# Patient Record
Sex: Male | Born: 1985 | Race: White | Hispanic: No | Marital: Single | Smoking: Never smoker
Health system: Southern US, Community
[De-identification: ages and names within clinical notes are randomized; demographics above are authoritative.]

## PROBLEM LIST (undated history)

## (undated) HISTORY — PX: LIPOSUCTION: SHX10

---

## 2005-03-14 ENCOUNTER — Emergency Department (HOSPITAL_COMMUNITY): Admission: EM | Admit: 2005-03-14 | Discharge: 2005-03-15 | Payer: Self-pay | Admitting: Emergency Medicine

## 2006-07-23 ENCOUNTER — Emergency Department (HOSPITAL_COMMUNITY): Admission: EM | Admit: 2006-07-23 | Discharge: 2006-07-23 | Payer: Self-pay | Admitting: Emergency Medicine

## 2006-07-23 ENCOUNTER — Emergency Department (HOSPITAL_COMMUNITY): Admission: EM | Admit: 2006-07-23 | Discharge: 2006-07-24 | Payer: Self-pay | Admitting: Emergency Medicine

## 2016-04-06 ENCOUNTER — Emergency Department (HOSPITAL_COMMUNITY): Payer: Medicaid Other

## 2016-04-06 ENCOUNTER — Encounter (HOSPITAL_COMMUNITY): Payer: Self-pay | Admitting: Emergency Medicine

## 2016-04-06 ENCOUNTER — Emergency Department (HOSPITAL_COMMUNITY)
Admission: EM | Admit: 2016-04-06 | Discharge: 2016-04-06 | Disposition: A | Discharge status: Home / Self Care | Payer: Medicaid Other | Attending: Emergency Medicine | Admitting: Emergency Medicine

## 2016-04-06 DIAGNOSIS — Y939 Activity, unspecified: Secondary | ICD-10-CM | POA: Insufficient documentation

## 2016-04-06 DIAGNOSIS — R918 Other nonspecific abnormal finding of lung field: Secondary | ICD-10-CM | POA: Diagnosis not present

## 2016-04-06 DIAGNOSIS — R102 Pelvic and perineal pain: Secondary | ICD-10-CM | POA: Diagnosis not present

## 2016-04-06 DIAGNOSIS — R410 Disorientation, unspecified: Secondary | ICD-10-CM | POA: Diagnosis not present

## 2016-04-06 DIAGNOSIS — S40811A Abrasion of right upper arm, initial encounter: Secondary | ICD-10-CM | POA: Insufficient documentation

## 2016-04-06 DIAGNOSIS — S80811A Abrasion, right lower leg, initial encounter: Secondary | ICD-10-CM | POA: Diagnosis not present

## 2016-04-06 DIAGNOSIS — Y92410 Unspecified street and highway as the place of occurrence of the external cause: Secondary | ICD-10-CM | POA: Diagnosis not present

## 2016-04-06 DIAGNOSIS — T148XXA Other injury of unspecified body region, initial encounter: Secondary | ICD-10-CM

## 2016-04-06 DIAGNOSIS — M542 Cervicalgia: Secondary | ICD-10-CM | POA: Insufficient documentation

## 2016-04-06 DIAGNOSIS — Y999 Unspecified external cause status: Secondary | ICD-10-CM | POA: Insufficient documentation

## 2016-04-06 DIAGNOSIS — S80812A Abrasion, left lower leg, initial encounter: Secondary | ICD-10-CM | POA: Diagnosis not present

## 2016-04-06 DIAGNOSIS — S40812A Abrasion of left upper arm, initial encounter: Secondary | ICD-10-CM | POA: Diagnosis not present

## 2016-04-06 DIAGNOSIS — S8991XA Unspecified injury of right lower leg, initial encounter: Secondary | ICD-10-CM | POA: Diagnosis present

## 2016-04-06 LAB — I-STAT CG4 LACTIC ACID, ED: LACTIC ACID, VENOUS: 5.67 mmol/L — AB (ref 0.5–1.9)

## 2016-04-06 LAB — ETHANOL: ALCOHOL ETHYL (B): 208 mg/dL — AB (ref <5)

## 2016-04-06 LAB — I-STAT CHEM 8, ED
BUN: 26 mg/dL — AB (ref 6–20)
CALCIUM ION: 1.06 mmol/L — AB (ref 1.13–1.30)
Chloride: 106 mmol/L (ref 101–111)
Creatinine, Ser: 1.4 mg/dL — ABNORMAL HIGH (ref 0.61–1.24)
GLUCOSE: 154 mg/dL — AB (ref 65–99)
HCT: 48 % (ref 39.0–52.0)
Hemoglobin: 16.3 g/dL (ref 13.0–17.0)
Potassium: 3.4 mmol/L — ABNORMAL LOW (ref 3.5–5.1)
SODIUM: 141 mmol/L (ref 135–145)
TCO2: 17 mmol/L (ref 0–100)

## 2016-04-06 LAB — COMPREHENSIVE METABOLIC PANEL
ALBUMIN: 4.1 g/dL (ref 3.5–5.0)
ALT: 59 U/L (ref 17–63)
ANION GAP: 13 (ref 5–15)
AST: 50 U/L — ABNORMAL HIGH (ref 15–41)
Alkaline Phosphatase: 92 U/L (ref 38–126)
BILIRUBIN TOTAL: 0.3 mg/dL (ref 0.3–1.2)
BUN: 21 mg/dL — ABNORMAL HIGH (ref 6–20)
CALCIUM: 8.6 mg/dL — AB (ref 8.9–10.3)
CO2: 15 mmol/L — ABNORMAL LOW (ref 22–32)
Chloride: 108 mmol/L (ref 101–111)
Creatinine, Ser: 1.3 mg/dL — ABNORMAL HIGH (ref 0.61–1.24)
GLUCOSE: 160 mg/dL — AB (ref 65–99)
POTASSIUM: 3.3 mmol/L — AB (ref 3.5–5.1)
Sodium: 136 mmol/L (ref 135–145)
TOTAL PROTEIN: 7.3 g/dL (ref 6.5–8.1)

## 2016-04-06 LAB — CBC
HEMATOCRIT: 46.2 % (ref 39.0–52.0)
HEMOGLOBIN: 16 g/dL (ref 13.0–17.0)
MCH: 32.4 pg (ref 26.0–34.0)
MCHC: 34.6 g/dL (ref 30.0–36.0)
MCV: 93.5 fL (ref 78.0–100.0)
Platelets: 267 10*3/uL (ref 150–400)
RBC: 4.94 MIL/uL (ref 4.22–5.81)
RDW: 12.6 % (ref 11.5–15.5)
WBC: 29 10*3/uL — AB (ref 4.0–10.5)

## 2016-04-06 LAB — PROTIME-INR
INR: 1.1 (ref 0.00–1.49)
PROTHROMBIN TIME: 14.4 s (ref 11.6–15.2)

## 2016-04-06 LAB — CDS SEROLOGY

## 2016-04-06 LAB — TYPE AND SCREEN
ABO/RH(D): O POS
Antibody Screen: NEGATIVE

## 2016-04-06 LAB — ABO/RH: ABO/RH(D): O POS

## 2016-04-06 MED ORDER — FENTANYL CITRATE (PF) 100 MCG/2ML IJ SOLN
100.0000 ug | Freq: Once | INTRAMUSCULAR | Status: AC
  Administered 2016-04-06: 100 ug via INTRAVENOUS
  Filled 2016-04-06: qty 2

## 2016-04-06 MED ORDER — TETANUS-DIPHTH-ACELL PERTUSSIS 5-2.5-18.5 LF-MCG/0.5 IM SUSP
0.5000 mL | Freq: Once | INTRAMUSCULAR | Status: AC
  Administered 2016-04-06: 0.5 mL via INTRAMUSCULAR
  Filled 2016-04-06: qty 0.5

## 2016-04-06 MED ORDER — FENTANYL CITRATE (PF) 100 MCG/2ML IJ SOLN
INTRAMUSCULAR | Status: AC
  Filled 2016-04-06: qty 2

## 2016-04-06 MED ORDER — IBUPROFEN 800 MG PO TABS
800.0000 mg | ORAL_TABLET | Freq: Once | ORAL | Status: AC
  Administered 2016-04-06: 800 mg via ORAL
  Filled 2016-04-06: qty 1

## 2016-04-06 MED ORDER — FENTANYL CITRATE (PF) 100 MCG/2ML IJ SOLN
100.0000 ug | Freq: Once | INTRAMUSCULAR | Status: AC
  Administered 2016-04-06: 100 ug via INTRAVENOUS

## 2016-04-06 MED ORDER — IOPAMIDOL (ISOVUE-300) INJECTION 61%
INTRAVENOUS | Status: AC
  Administered 2016-04-06: 100 mL
  Filled 2016-04-06: qty 100

## 2016-04-06 MED ORDER — FENTANYL CITRATE (PF) 100 MCG/2ML IJ SOLN
50.0000 ug | Freq: Once | INTRAMUSCULAR | Status: AC
  Administered 2016-04-06: 50 ug via INTRAMUSCULAR
  Filled 2016-04-06: qty 2

## 2016-04-06 MED ORDER — MORPHINE SULFATE 15 MG PO TABS: 15.0000 mg | ORAL_TABLET | ORAL | Status: AC | PRN

## 2016-04-06 MED ORDER — ACETAMINOPHEN 500 MG PO TABS
1000.0000 mg | ORAL_TABLET | Freq: Once | ORAL | Status: AC
  Administered 2016-04-06: 1000 mg via ORAL
  Filled 2016-04-06: qty 2

## 2016-04-06 NOTE — ED Notes (Signed)
Dr. Adela Lank notified that pt. became near syncopal while assisting to get up to and change to paper scrubs .

## 2016-04-06 NOTE — ED Notes (Signed)
Rolled towels placed under bilat knees for comfort
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
## 2016-04-06 NOTE — Discharge Instructions (Signed)
Take 4 over the counter ibuprofen tablets 3 times a day or 2 over-the-counter naproxen tablets twice a day for pain. Also take tylenol 1000mg (2 extra strength) four times a day.   Then take the pain medicine if you feel like you need it. Narcotics do not help with the pain, they only make you care about it less.  You can become addicted to this, people may break into your house to steal it.  It will constipate you.  If you drive under the influence of this medicine you can get a DUI.    Musculoskeletal Pain Musculoskeletal pain is muscle and boney aches and pains. These pains can occur in any part of the body. Your caregiver may treat you without knowing the cause of the pain. They may treat you if blood or urine tests, X-rays, and other tests were normal.  CAUSES There is often not a definite cause or reason for these pains. These pains may be caused by a type of germ (virus). The discomfort may also come from overuse. Overuse includes working out too hard when your body is not fit. Boney aches also come from weather changes. Bone is sensitive to atmospheric pressure changes. HOME CARE INSTRUCTIONS   Ask when your test results will be ready. Make sure you get your test results.  Only take over-the-counter or prescription medicines for pain, discomfort, or fever as directed by your caregiver. If you were given medications for your condition, do not drive, operate machinery or power tools, or sign legal documents for 24 hours. Do not drink alcohol. Do not take sleeping pills or other medications that may interfere with treatment.  Continue all activities unless the activities cause more pain. When the pain lessens, slowly resume normal activities. Gradually increase the intensity and duration of the activities or exercise.  During periods of severe pain, bed rest may be helpful. Lay or sit in any position that is comfortable.  Putting ice on the injured area.  Put ice in a bag.  Place a towel  between your skin and the bag.  Leave the ice on for 15 to 20 minutes, 3 to 4 times a day.  Follow up with your caregiver for continued problems and no reason can be found for the pain. If the pain becomes worse or does not go away, it may be necessary to repeat tests or do additional testing. Your caregiver may need to look further for a possible cause. SEEK IMMEDIATE MEDICAL CARE IF:  You have pain that is getting worse and is not relieved by medications.  You develop chest pain that is associated with shortness or breath, sweating, feeling sick to your stomach (nauseous), or throw up (vomit).  Your pain becomes localized to the abdomen.  You develop any new symptoms that seem different or that concern you. MAKE SURE YOU:   Understand these instructions.  Will watch your condition.  Will get help right away if you are not doing well or get worse.   This information is not intended to replace advice given to you by your health care provider. Make sure you discuss any questions you have with your health care provider.   Document Released: 09/02/2005 Document Revised: 11/25/2011 Document Reviewed: 05/07/2013 Elsevier Interactive Patient Education [employer]

## 2016-04-06 NOTE — ED Notes (Signed)
Patient transported to CT scan . 
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
## 2016-04-06 NOTE — ED Notes (Signed)
Dr. Adela Lank was notified about the critical lab.Marland KitchenMarland KitchenMarland Kitchen

## 2016-04-09 ENCOUNTER — Encounter (HOSPITAL_COMMUNITY): Payer: Self-pay | Admitting: Emergency Medicine

## 2016-04-09 ENCOUNTER — Emergency Department (HOSPITAL_COMMUNITY)
Admission: EM | Admit: 2016-04-09 | Discharge: 2016-04-09 | Disposition: A | Discharge status: Home / Self Care | Payer: Medicaid Other | Attending: Emergency Medicine | Admitting: Emergency Medicine

## 2016-04-09 DIAGNOSIS — T148XXA Other injury of unspecified body region, initial encounter: Secondary | ICD-10-CM

## 2016-04-09 DIAGNOSIS — Y92410 Unspecified street and highway as the place of occurrence of the external cause: Secondary | ICD-10-CM | POA: Diagnosis not present

## 2016-04-09 DIAGNOSIS — Z79899 Other long term (current) drug therapy: Secondary | ICD-10-CM | POA: Diagnosis not present

## 2016-04-09 DIAGNOSIS — Y999 Unspecified external cause status: Secondary | ICD-10-CM | POA: Diagnosis not present

## 2016-04-09 DIAGNOSIS — S7001XA Contusion of right hip, initial encounter: Secondary | ICD-10-CM | POA: Insufficient documentation

## 2016-04-09 DIAGNOSIS — S7002XA Contusion of left hip, initial encounter: Secondary | ICD-10-CM | POA: Diagnosis not present

## 2016-04-09 DIAGNOSIS — Y939 Activity, unspecified: Secondary | ICD-10-CM | POA: Diagnosis not present

## 2016-04-09 DIAGNOSIS — M25552 Pain in left hip: Secondary | ICD-10-CM | POA: Diagnosis present

## 2016-04-09 MED ORDER — DIAZEPAM 5 MG PO TABS
10.0000 mg | ORAL_TABLET | Freq: Once | ORAL | Status: AC
  Administered 2016-04-09: 10 mg via ORAL
  Filled 2016-04-09: qty 2

## 2016-04-09 MED ORDER — OXYCODONE-ACETAMINOPHEN 5-325 MG PO TABS: 2.0000 | ORAL_TABLET | ORAL | 0 refills | Status: DC | PRN

## 2016-04-09 MED ORDER — HYDROMORPHONE HCL 1 MG/ML IJ SOLN
1.0000 mg | Freq: Once | INTRAMUSCULAR | Status: AC
  Administered 2016-04-09: 1 mg via INTRAMUSCULAR
  Filled 2016-04-09 (×2): qty 1

## 2016-04-09 MED ORDER — DIAZEPAM 5 MG PO TABS: 5.0000 mg | ORAL_TABLET | ORAL | 0 refills | Status: AC | PRN

## 2016-04-09 MED ORDER — PREDNISONE 50 MG PO TABS: ORAL_TABLET | ORAL | 0 refills | Status: AC

## 2016-04-09 MED ORDER — DEXAMETHASONE SODIUM PHOSPHATE 10 MG/ML IJ SOLN
10.0000 mg | Freq: Once | INTRAMUSCULAR | Status: AC
  Administered 2016-04-09: 10 mg via INTRAMUSCULAR
  Filled 2016-04-09: qty 1

## 2016-04-09 NOTE — ED Notes (Signed)
PT DISCHARGED. INSTRUCTIONS AND PRESCRIPTIONS GIVEN. AAOX4. PT IN NO APPARENT DISTRESS. THE OPPORTUNITY TO ASK QUESTIONS WAS PROVIDED. 
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
## 2016-04-09 NOTE — ED Notes (Signed)
Bed: WA03 Expected date:  Expected time:  Means of arrival:  Comments: 
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
## 2016-04-10 ENCOUNTER — Encounter (HOSPITAL_COMMUNITY): Payer: Self-pay | Admitting: Emergency Medicine

## 2017-10-08 ENCOUNTER — Other Ambulatory Visit (HOSPITAL_COMMUNITY): Payer: Self-pay | Admitting: Orthopedic Surgery

## 2017-10-08 DIAGNOSIS — R2242 Localized swelling, mass and lump, left lower limb: Secondary | ICD-10-CM

## 2017-10-20 ENCOUNTER — Ambulatory Visit (HOSPITAL_COMMUNITY)
Admission: RE | Admit: 2017-10-20 | Discharge: 2017-10-20 | Disposition: A | Discharge status: Home / Self Care | Payer: BLUE CROSS/BLUE SHIELD | Source: Ambulatory Visit | Attending: Orthopedic Surgery | Admitting: Orthopedic Surgery

## 2017-10-20 DIAGNOSIS — R2242 Localized swelling, mass and lump, left lower limb: Secondary | ICD-10-CM | POA: Diagnosis present

## 2017-10-20 DIAGNOSIS — R9389 Abnormal findings on diagnostic imaging of other specified body structures: Secondary | ICD-10-CM | POA: Insufficient documentation

## 2017-10-20 DIAGNOSIS — M160 Bilateral primary osteoarthritis of hip: Secondary | ICD-10-CM | POA: Diagnosis not present

## 2017-10-20 MED ORDER — GADOBENATE DIMEGLUMINE 529 MG/ML IV SOLN
20.0000 mL | Freq: Once | INTRAVENOUS | Status: AC
  Administered 2017-10-20: 20 mL via INTRAVENOUS

## 2018-04-02 ENCOUNTER — Other Ambulatory Visit: Payer: Self-pay

## 2018-04-02 ENCOUNTER — Encounter (HOSPITAL_COMMUNITY): Payer: Self-pay

## 2018-04-02 ENCOUNTER — Emergency Department (HOSPITAL_COMMUNITY)
Admission: EM | Admit: 2018-04-02 | Discharge: 2018-04-02 | Disposition: A | Discharge status: Home / Self Care | Payer: BLUE CROSS/BLUE SHIELD | Attending: Emergency Medicine | Admitting: Emergency Medicine

## 2018-04-02 DIAGNOSIS — R51 Headache: Secondary | ICD-10-CM | POA: Diagnosis not present

## 2018-04-02 DIAGNOSIS — Z5321 Procedure and treatment not carried out due to patient leaving prior to being seen by health care provider: Secondary | ICD-10-CM | POA: Insufficient documentation

## 2018-04-02 DIAGNOSIS — R519 Headache, unspecified: Secondary | ICD-10-CM

## 2018-04-02 MED ORDER — SODIUM CHLORIDE 0.9 % IV SOLN: Freq: Once | INTRAVENOUS | Status: DC

## 2018-04-02 NOTE — ED Triage Notes (Signed)
Pt states that he has had a migraine since 1130 with photosensitivity and nausea

## 2018-04-02 NOTE — ED Provider Notes (Signed)
Just as I went for evaluation the patient, and he was preparing to leave the room. I encouraged him to stay for evaluation, but patient had already removed his IV line himself and declined evaluation.    Victor Becker, Victor Stammen, MD 04/02/18 1019

## 2018-04-02 NOTE — ED Notes (Addendum)
Patient's guest expressed frustration with time it took for medication. Received verbal for fluids and upon immediately returning to room with fluids patient stated he wanted to go immediately. I asked patient to let me get the physician and returned to the room within 1 minute with physician. Patient had removed IV by himself, stating he was leaving. Patient stated no pain and no need for treatment. Patient would not wait to sign discharge paperwork.

## 2020-07-07 ENCOUNTER — Other Ambulatory Visit (HOSPITAL_BASED_OUTPATIENT_CLINIC_OR_DEPARTMENT_OTHER): Payer: Self-pay | Admitting: Physician Assistant

## 2020-07-07 DIAGNOSIS — M79671 Pain in right foot: Secondary | ICD-10-CM

## 2020-07-08 ENCOUNTER — Other Ambulatory Visit: Payer: Self-pay

## 2020-07-08 ENCOUNTER — Ambulatory Visit (HOSPITAL_BASED_OUTPATIENT_CLINIC_OR_DEPARTMENT_OTHER)
Admission: RE | Admit: 2020-07-08 | Discharge: 2020-07-08 | Disposition: A | Discharge status: Home / Self Care | Payer: Self-pay | Source: Ambulatory Visit | Attending: Physician Assistant | Admitting: Physician Assistant

## 2020-07-08 DIAGNOSIS — M79671 Pain in right foot: Secondary | ICD-10-CM | POA: Insufficient documentation

## 2020-07-09 ENCOUNTER — Emergency Department (HOSPITAL_COMMUNITY)
Admission: EM | Admit: 2020-07-09 | Discharge: 2020-07-10 | Disposition: A | Discharge status: Home / Self Care | Payer: Self-pay | Attending: Emergency Medicine | Admitting: Emergency Medicine

## 2020-07-09 ENCOUNTER — Emergency Department (HOSPITAL_COMMUNITY): Payer: Self-pay

## 2020-07-09 DIAGNOSIS — R600 Localized edema: Secondary | ICD-10-CM | POA: Insufficient documentation

## 2020-07-09 DIAGNOSIS — R609 Edema, unspecified: Secondary | ICD-10-CM

## 2020-07-09 DIAGNOSIS — M79671 Pain in right foot: Secondary | ICD-10-CM | POA: Insufficient documentation

## 2020-07-09 LAB — CBC WITH DIFFERENTIAL/PLATELET
Abs Immature Granulocytes: 0.15 10*3/uL — ABNORMAL HIGH (ref 0.00–0.07)
Basophils Absolute: 0.1 10*3/uL (ref 0.0–0.1)
Basophils Relative: 1 %
Eosinophils Absolute: 0.1 10*3/uL (ref 0.0–0.5)
Eosinophils Relative: 1 %
HCT: 46 % (ref 39.0–52.0)
Hemoglobin: 15.6 g/dL (ref 13.0–17.0)
Immature Granulocytes: 1 %
Lymphocytes Relative: 14 %
Lymphs Abs: 2.1 10*3/uL (ref 0.7–4.0)
MCH: 31.8 pg (ref 26.0–34.0)
MCHC: 33.9 g/dL (ref 30.0–36.0)
MCV: 93.9 fL (ref 80.0–100.0)
Monocytes Absolute: 1.3 10*3/uL — ABNORMAL HIGH (ref 0.1–1.0)
Monocytes Relative: 9 %
Neutro Abs: 10.7 10*3/uL — ABNORMAL HIGH (ref 1.7–7.7)
Neutrophils Relative %: 74 %
Platelets: 264 10*3/uL (ref 150–400)
RBC: 4.9 MIL/uL (ref 4.22–5.81)
RDW: 12.3 % (ref 11.5–15.5)
WBC: 14.4 10*3/uL — ABNORMAL HIGH (ref 4.0–10.5)
nRBC: 0 % (ref 0.0–0.2)

## 2020-07-09 LAB — BASIC METABOLIC PANEL
Anion gap: 11 (ref 5–15)
BUN: 22 mg/dL — ABNORMAL HIGH (ref 6–20)
CO2: 22 mmol/L (ref 22–32)
Calcium: 9 mg/dL (ref 8.9–10.3)
Chloride: 105 mmol/L (ref 98–111)
Creatinine, Ser: 0.9 mg/dL (ref 0.61–1.24)
GFR, Estimated: >60 mL/min (ref >60)
Glucose, Bld: 176 mg/dL — ABNORMAL HIGH (ref 70–99)
Potassium: 4 mmol/L (ref 3.5–5.1)
Sodium: 138 mmol/L (ref 135–145)

## 2020-07-09 LAB — URIC ACID: Uric Acid, Serum: 5.6 mg/dL (ref 3.7–8.6)

## 2020-07-09 LAB — C-REACTIVE PROTEIN: CRP: 1.8 mg/dL — ABNORMAL HIGH (ref <1.0)

## 2020-07-09 LAB — SEDIMENTATION RATE: Sed Rate: 22 mm/hr — ABNORMAL HIGH (ref 0–16)

## 2020-07-09 MED ORDER — CEFAZOLIN SODIUM-DEXTROSE 2-4 GM/100ML-% IV SOLN
2.0000 g | Freq: Once | INTRAVENOUS | Status: AC
  Administered 2020-07-09: 2 g via INTRAVENOUS
  Filled 2020-07-09: qty 100

## 2020-07-09 MED ORDER — OXYCODONE-ACETAMINOPHEN 5-325 MG PO TABS
1.0000 | ORAL_TABLET | Freq: Once | ORAL | Status: AC
  Administered 2020-07-09: 1 via ORAL
  Filled 2020-07-09: qty 1

## 2020-07-09 MED ORDER — KETOROLAC TROMETHAMINE 30 MG/ML IJ SOLN
30.0000 mg | Freq: Once | INTRAMUSCULAR | Status: AC
  Administered 2020-07-09: 30 mg via INTRAVENOUS
  Filled 2020-07-09: qty 1

## 2020-07-09 NOTE — ED Provider Notes (Signed)
[facility] EMERGENCY DEPARTMENT Provider Note   CSN: [id number] Arrival date & time: 07/09/20  1718     History Chief Complaint  Patient presents with  . Foot Pain    Victor Becker is a 34 y.o. male without significant past medical history, presenting emergency department with complaint of worsening right foot pain that initially began on Wednesday or Thursday of this week. He states he initially began having pain to the dorsum of his foot. Yesterday he noticed redness that developed with worsening swelling. Pain is mostly localized at the base of the third toe with pain with range of motion and ambulation. No injuries or wounds. He was seen at [facility] and treated with prednisone for possibility of gout without relief. He has been taking Bactrim for a wound on his right hand that has been healing well since Wednesday. No fevers or chills. No known history of gout. He is not know his family history. He does endorse alcohol use and had quite a bit of alcohol on the 17th of this month. No history of IV drug use, HIV, diabetes.   The history is provided by the patient.       No past medical history on file.  There are no problems to display for this patient.   Past Surgical History:  Procedure Laterality Date  . LIPOSUCTION         No family history on file.  Social History   Tobacco Use  . Smoking status: Never Smoker  . Smokeless tobacco: Never Used  Substance Use Topics  . Alcohol use: Yes    Comment: occassional  . Drug use: No    Types: Marijuana    Home Medications Prior to Admission medications   Medication Sig Start Date End Date Taking? Authorizing Provider  cephALEXin (KEFLEX) 500 MG capsule Take 1 capsule (500 mg total) by mouth 3 (three) times daily for 7 days. 07/10/20 07/17/20  Jerry Clyne, [place] N, PA-C  diazepam (VALIUM) 5 MG tablet Take 1 tablet (5 mg total) by mouth every 4 (four) hours as needed for muscle spasms.  04/09/16   Lorre Nick, MD  morphine (MSIR) 15 MG tablet Take 15 mg by mouth every 6 (six) hours as needed for severe pain.    [provider]  morphine (MSIR) 15 MG tablet Take 1 tablet (15 mg total) by mouth every 4 (four) hours as needed for severe pain. 04/06/16   Melene Plan, DO  oxyCODONE-acetaminophen (PERCOCET/ROXICET) 5-325 MG tablet Take 1-2 tablets by mouth every 6 (six) hours as needed for severe pain. 07/10/20   Estrella Alcaraz, [place], PA-C  predniSONE (DELTASONE) 50 MG tablet One po qd x 5 days 04/09/16   Lorre Nick, MD    Allergies    Patient has no known allergies.  Review of Systems   Review of Systems  Constitutional: Negative for chills and fever.  Musculoskeletal: Positive for arthralgias and myalgias.  Skin: Positive for color change. Negative for wound.  Allergic/Immunologic: Negative for immunocompromised state.  Neurological: Negative for numbness.  All other systems reviewed and are negative.   Physical Exam Updated Vital Signs BP 118/75 (BP Location: Right Arm)   Pulse 74   Temp 97.8 F (36.6 C) (Oral)   Resp 16   SpO2 99%   Physical Exam Vitals and nursing note reviewed.  Constitutional:      General: He is not in acute distress.    Appearance: He is well-developed.  HENT:  Head: Normocephalic and atraumatic.  Eyes:     Conjunctiva/sclera: Conjunctivae normal.  Cardiovascular:     Rate and Rhythm: Normal rate and regular rhythm.  Pulmonary:     Effort: Pulmonary effort is normal.     Breath sounds: Normal breath sounds.  Abdominal:     Palpations: Abdomen is soft.  Musculoskeletal:     Comments: Dorsum of distal right foot with erythema, warmth, swelling. This seems to localize about the base of the 3rd digit with associated TTP starting at the 3rd MTP joint and proximally into the foot. Pain with ROM of the 3rd MTP joint, no pain with ROM of the interphalangeal joints of the toe. The erythema does not extend to the toes. No wounds.  No streaking. Normal sensation and DP pulse.  Skin:    General: Skin is warm.  Neurological:     Mental Status: He is alert.  Psychiatric:        Behavior: Behavior normal.     ED Results / Procedures / Treatments   Labs (all labs ordered are listed, but only abnormal results are displayed) Labs Reviewed  CBC WITH DIFFERENTIAL/PLATELET - Abnormal; Notable for the following components:      Result Value   WBC 14.4 (*)    Neutro Abs 10.7 (*)    Monocytes Absolute 1.3 (*)    Abs Immature Granulocytes 0.15 (*)    All other components within normal limits  BASIC METABOLIC PANEL - Abnormal; Notable for the following components:   Glucose, Bld 176 (*)    BUN 22 (*)    All other components within normal limits  SEDIMENTATION RATE - Abnormal; Notable for the following components:   Sed Rate 22 (*)    All other components within normal limits  C-REACTIVE PROTEIN - Abnormal; Notable for the following components:   CRP 1.8 (*)    All other components within normal limits  URIC ACID    EKG None  Radiology MR FOOT RIGHT WO CONTRAST  Result Date: 07/09/2020 CLINICAL DATA:  Foot pain, chronic, inflammatory arthropathy suspected EXAM: MRI OF THE RIGHT FOREFOOT WITHOUT CONTRAST TECHNIQUE: Multiplanar, multisequence MR imaging of the was performed. No intravenous contrast was administered. COMPARISON:  None. FINDINGS: Bones/Joint/Cartilage Mildly increased marrow signal without T1 hypointensity seen within the third metatarsal head. No areas of cortical destruction or periosteal reaction are noted. There is a small joint effusion seen at the second through fourth MTP joints. Ligaments The Lisfranc ligaments are intact. Muscles and Tendons There is increased feathery signal seen within the dorsal and plantar interossei muscle surrounding the third MTP joint. The flexor and extensor tendons appear to be intact. Soft tissues There is diffuse dorsal subcutaneous edema seen along the forefoot. There  is also edema within the plantar subcutaneous soft tissues. IMPRESSION: Third metatarsal head reactive marrow/edema which could be due to inflammatory process. No definite evidence of osteomyelitis. Signal changes within the interossei muscles surrounding the third MTP joint, which could be due to myositis Dorsal subcutaneous edema and no evidence of soft tissue abscess Electronically Signed   By: Jonna ClarkBindu  Avutu M.D.   On: 07/09/2020 23:58   US Venous Img Lower Unilateral Right (DVT)  Result Date: 07/08/2020 CLINICAL DATA:  Right lower extremity pain and edema. EXAM: RIGHT LOWER EXTREMITY VENOUS DOPPLER ULTRASOUND TECHNIQUE: Gray-scale sonography with graded compression, as well as color Doppler and duplex ultrasound were performed to evaluate the lower extremity deep venous systems from the level of the common femoral vein and  including the common femoral, femoral, profunda femoral, popliteal and calf veins including the posterior tibial, peroneal and gastrocnemius veins when visible. The superficial great saphenous vein was also interrogated. Spectral Doppler was utilized to evaluate flow at rest and with distal augmentation maneuvers in the common femoral, femoral and popliteal veins. COMPARISON:  None. FINDINGS: Contralateral Common Femoral Vein: Respiratory phasicity is normal and symmetric with the symptomatic side. No evidence of thrombus. Normal compressibility. Common Femoral Vein: No evidence of thrombus. Normal compressibility, respiratory phasicity and response to augmentation. Saphenofemoral Junction: No evidence of thrombus. Normal compressibility and flow on color Doppler imaging. Profunda Femoral Vein: No evidence of thrombus. Normal compressibility and flow on color Doppler imaging. Femoral Vein: No evidence of thrombus. Normal compressibility, respiratory phasicity and response to augmentation. Popliteal Vein: No evidence of thrombus. Normal compressibility, respiratory phasicity and response to  augmentation. Calf Veins: No evidence of thrombus. Normal compressibility and flow on color Doppler imaging. Superficial Great Saphenous Vein: No evidence of thrombus. Normal compressibility. Venous Reflux:  None. Other Findings: No evidence of superficial thrombophlebitis or abnormal fluid collection. IMPRESSION: No evidence of right lower extremity deep venous thrombosis. Electronically Signed   By: Irish Lack M.D.   On: 07/08/2020 11:56   DG Foot Complete Right  Result Date: 07/09/2020 CLINICAL DATA:  Pain and edema. EXAM: RIGHT FOOT COMPLETE - 3+ VIEW COMPARISON:  None. FINDINGS: There is no evidence of fracture or dislocation. There is no evidence of arthropathy or other focal bone abnormality. Soft tissues are unremarkable. IMPRESSION: Negative. Electronically Signed   By: Katherine Mantle M.D.   On: 07/09/2020 18:50    Procedures Procedures (including critical care time)  Medications Ordered in ED Medications  oxyCODONE-acetaminophen (PERCOCET/ROXICET) 5-325 MG per tablet 1 tablet (1 tablet Oral Given 07/09/20 2037)  ceFAZolin (ANCEF) IVPB 2g/100 mL premix (0 g Intravenous Stopped 07/09/20 2233)  ketorolac (TORADOL) 30 MG/ML injection 30 mg (30 mg Intravenous Given 07/09/20 2127)    ED Course  I have reviewed the triage vital signs and the nursing notes.  Pertinent labs & imaging results that were available during my care of the patient were reviewed by me and considered in my medical decision making (see chart for details).  Clinical Course as of Jul 10 10  Wynelle Link Jul 09, 2020  2109 Consulted with orthopedic specialist Dr. Charlann Boxer.  Discussed patient's laboratory work-up and history/presentation as well as examination today.  Discussed concerns for possible gout versus septic joint versus cellulitis though failed treatment with prednisone and Bactrim antibiotic.  He recommends MRI of the foot for further evaluation.  These imaging results will be reviewed and followed by  orthopedics during outpatient follow-up this week with Dr. Victorino Dike either on Monday or Wednesday in clinic.  He recommends a 2 g dose of IV Ancef here as well as Toradol for anti-inflammatory benefit.  As patient is stable without systemic symptoms and well-appearing, reliable for follow-up, recommends he is appropriate for recommend discharge home with Keflex.    [JR]    Clinical Course User Index [JR] Eliya Bubar, [place], PA-C   MDM Rules/Calculators/A&P                          Patient presenting with worsening pain since, swelling, redness to right foot seems to localize about the third MTP joint.  He has pain with range of motion.  No history of gout though does endorse alcohol use.  No systemic symptoms.  No history  of immunocompromise or IV drug use.  No injuries or wounds.  Neurovascularly intact.  Considered gout versus cellulitis versus septic arthritis.  Patient however has been taking Bactrim since Wednesday for a skin infection on his hand that is well-healing, however redness and warmth is present despite antibiotic use.  He is also been taking prednisone for possible gouty arthritis, however symptoms continue to worsen.  Patient also had outpatient venous ultrasound done to evaluate for DVT which was negative with negative plain films outpatient.  Plain films were repeated today and remain negative for obvious evidence of osteomyelitis or other arthritis.  Laboratory work-up obtained which reveals slight leukocytosis of 14.4, unclear if due to prednisone versus infection.  Inflammatory markers, sed rate is elevated at 22, CRP also elevated at 1.8.  Uric acid is within normal limits.  Patient discussed with and evaluated by Dr. Madilyn Hook.  Consulted with orthopedic surgeon Dr. Charlann Boxer who recommended IV Ancef, Toradol, MRI of his foot.  MRI results to be reviewed and followed by orthopedics outpatient.  Patient seems reliable for follow-up and agrees with this care plan.  Pain treated in the ED while  awaiting MRI.  MRI with findings with inflammatory changes to the third metatarsal head, no clear evidence of osteomyelitis.  Findings of possible myositis. Will discharge as planned with Keflex, NSAIDs, pain medication, and referral to Dr. Victorino Dike with orthopedics for close follow-up.  He is instructed of strict return precautions, including fevers/chills, or worsening symptoms.  [place] Controlled Substance reporting System queried  Final Clinical Impression(s) / ED Diagnoses Final diagnoses:  Edema  Right foot pain    Rx / DC Orders ED Discharge Orders         Ordered    oxyCODONE-acetaminophen (PERCOCET/ROXICET) 5-325 MG tablet  Every 6 hours PRN        07/10/20 0010    cephALEXin (KEFLEX) 500 MG capsule  3 times daily        07/10/20 0010           Kaja Jackowski, [place], PA-C 07/10/20 0011    Tilden Fossa, MD 07/13/20 [phone]

## 2020-07-09 NOTE — ED Triage Notes (Signed)
Pt arrives via pov with reports of R foot pain with swelling and redness X4 days. States seen for same and xray negative as well as DVT scan negative. Pt unable to bear weight.

## 2020-07-09 NOTE — ED Notes (Signed)
Pt transported to MRI 

## 2020-07-09 NOTE — ED Notes (Signed)
Pt transported to XRAY °

## 2020-07-10 MED ORDER — CEPHALEXIN 500 MG PO CAPS: 500.0000 mg | ORAL_CAPSULE | Freq: Three times a day (TID) | ORAL | 0 refills | Status: AC

## 2020-07-10 MED ORDER — OXYCODONE-ACETAMINOPHEN 5-325 MG PO TABS
1.0000 | ORAL_TABLET | Freq: Four times a day (QID) | ORAL | 0 refills | Status: AC | PRN
Start: 2020-07-10 — End: ?

## 2020-07-10 NOTE — Discharge Instructions (Addendum)
Begin taking the antibiotic, Keflex, as directed until gone. Take 600 mg of ibuprofen every 6 hours for pain and swelling. Elevate your foot is much as possible, apply ice for 20 minutes at a time. You can take the oxycodone every 6 hours for more severe pain.  Do not take tramadol with this. First thing in the morning tomorrow, call Dr. Laverta Baltimore office to schedule an appointment on either Monday or Wednesday of this week for close follow-up and further management of your MRI findings and right foot pain. Return to the emergency department if you develop fever or significantly worsening symptoms.

## 2022-10-11 IMAGING — US US EXTREM LOW VENOUS*R*
1 series · 13 of 24 positions shown · non-contrast
Comparison: None.

CLINICAL DATA: Right lower extremity pain and edema.



[Series 1: us extrem low venous*right* · 13 of 32 slices shown]
[im 1/32]
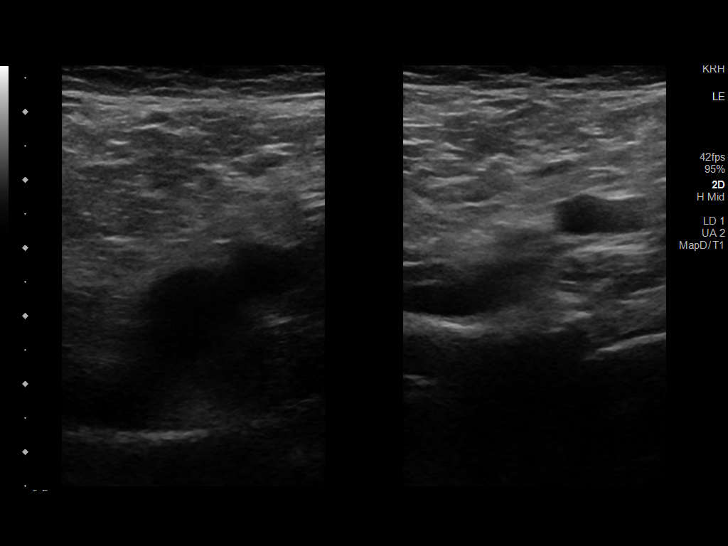
[im 3/32]
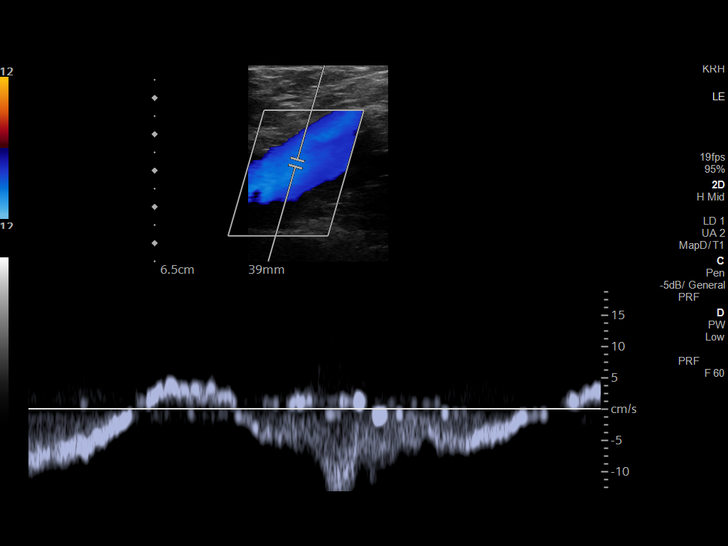
[im 6/32]
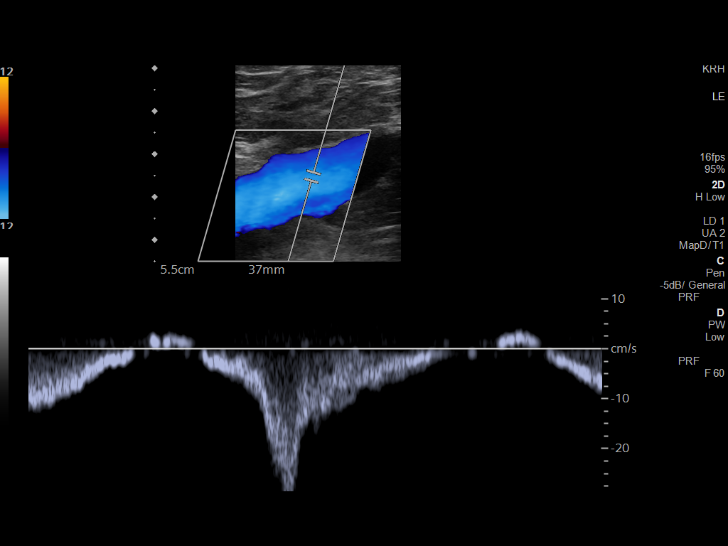
[im 9/32]
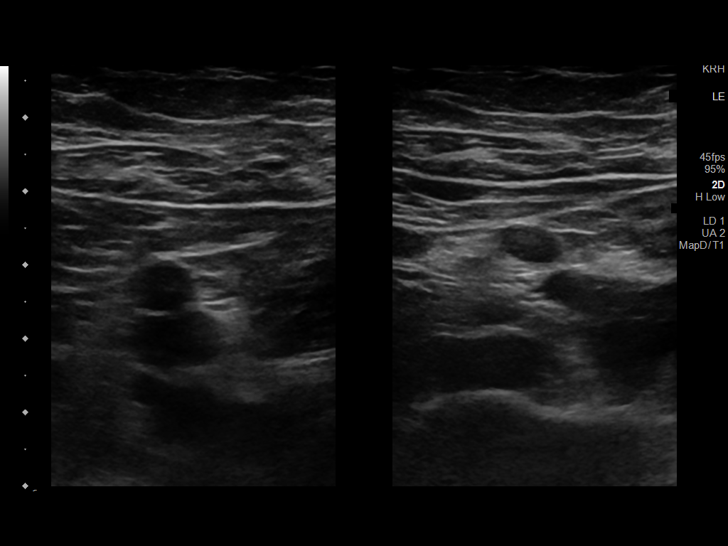
[im 11/32]
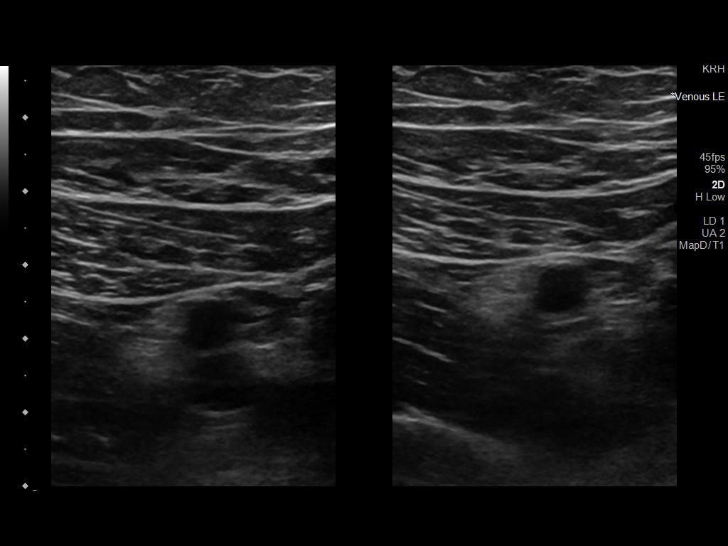
[im 14/32]
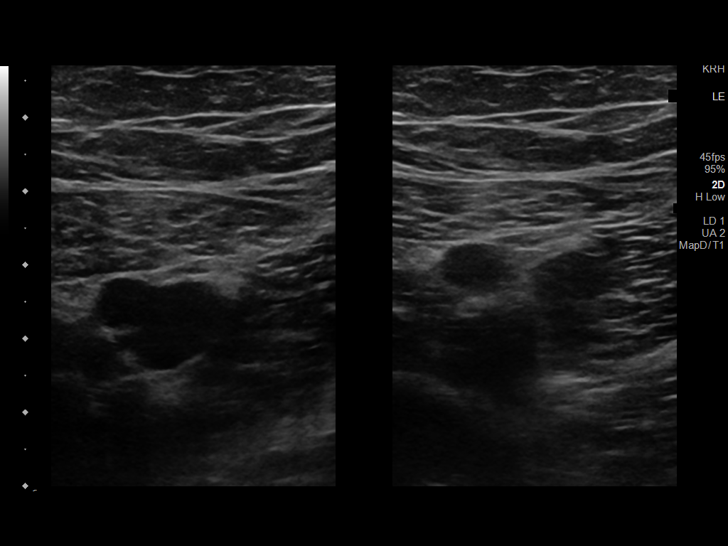
[im 17/32]
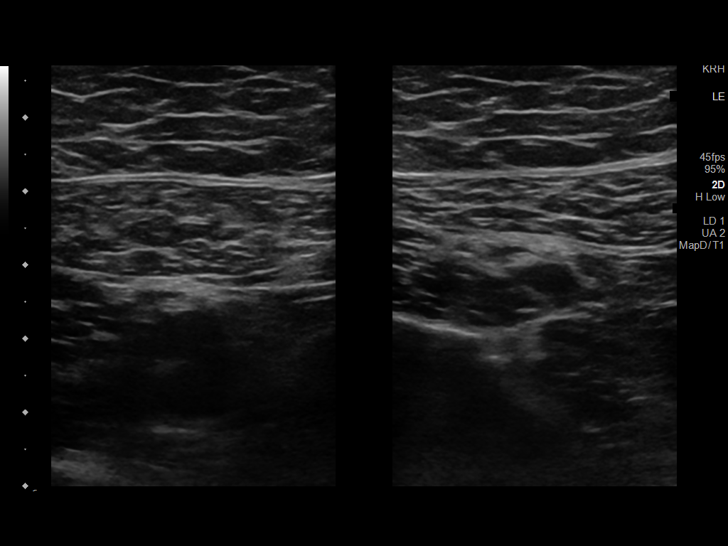
[im 18/32]
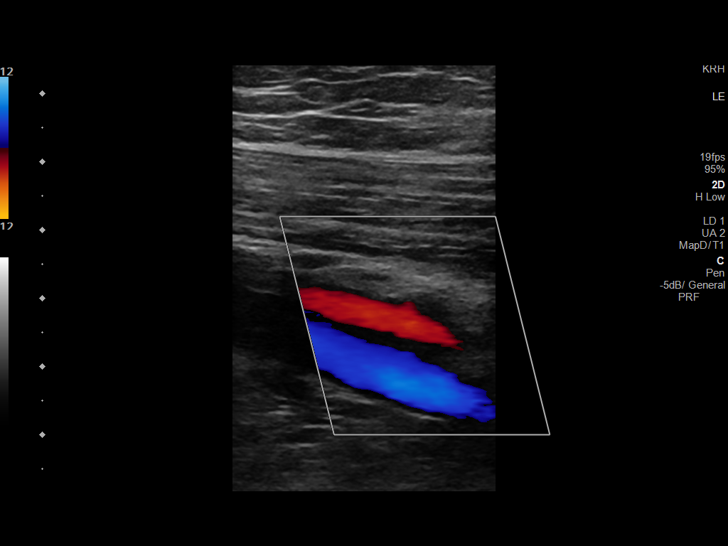
[im 21/32]
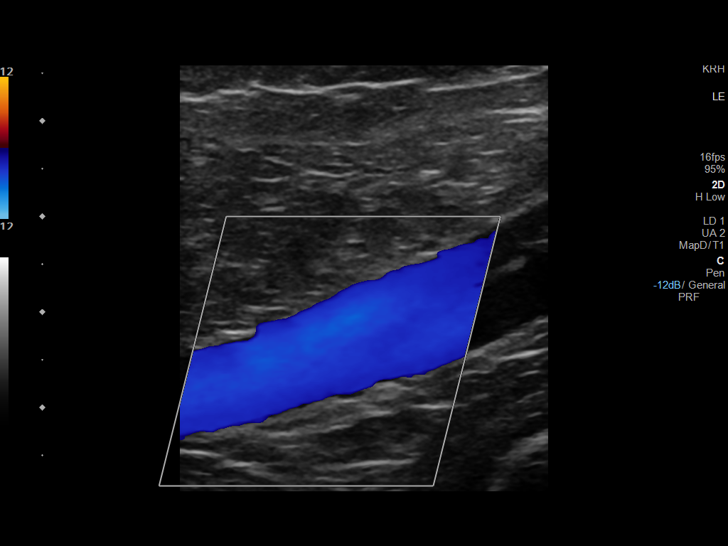
[im 23/32]
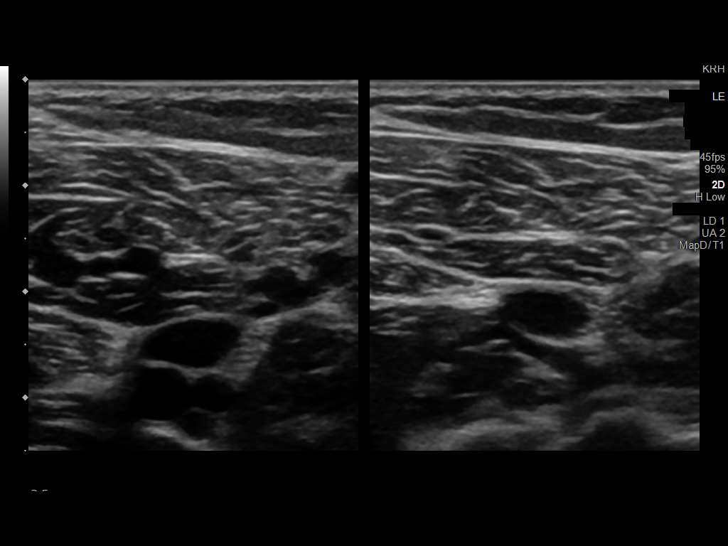
[im 26/32]
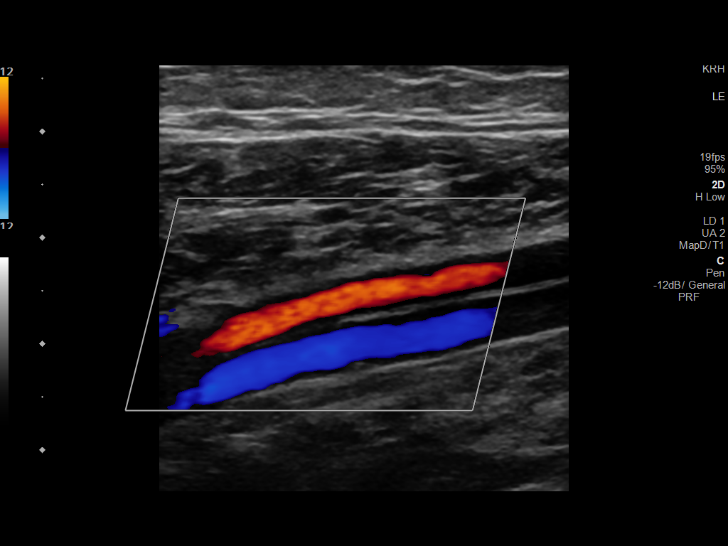
[im 29/32]
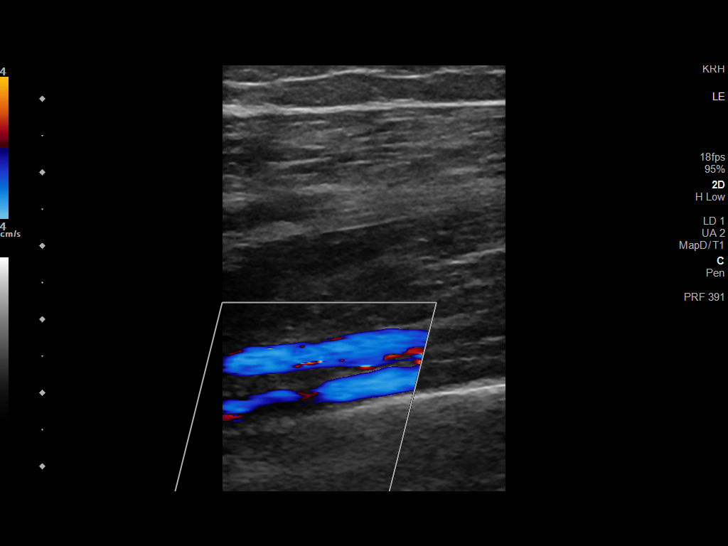
[im 32/32]
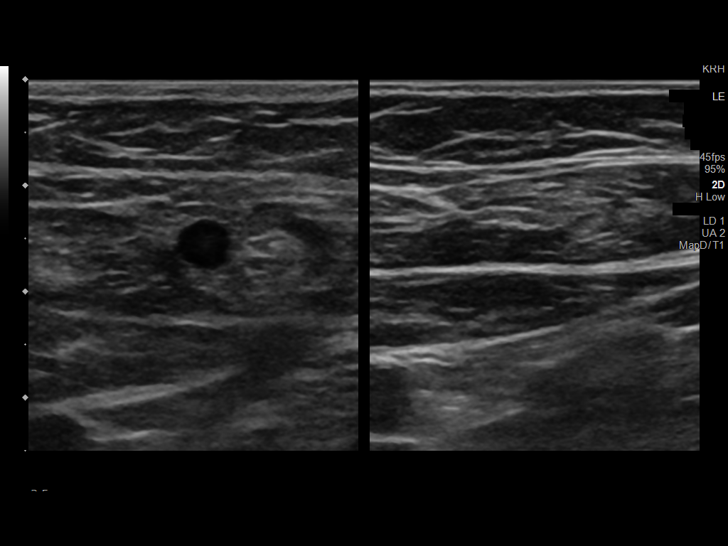

[13 of 24 positions shown; findings below may reference images not displayed]

FINDINGS: Contralateral Common Femoral Vein: Respiratory phasicity is normal
and symmetric with the symptomatic side. No evidence of thrombus.
Normal compressibility.

Common Femoral Vein: No evidence of thrombus. Normal
compressibility, respiratory phasicity and response to augmentation.

Saphenofemoral Junction: No evidence of thrombus. Normal
compressibility and flow on color Doppler imaging.

Profunda Femoral Vein: No evidence of thrombus. Normal
compressibility and flow on color Doppler imaging.

Femoral Vein: No evidence of thrombus. Normal compressibility,
respiratory phasicity and response to augmentation.

Popliteal Vein: No evidence of thrombus. Normal compressibility,
respiratory phasicity and response to augmentation.

Calf Veins: No evidence of thrombus. Normal compressibility and flow
on color Doppler imaging.

Superficial Great Saphenous Vein: No evidence of thrombus. Normal
compressibility.

Venous Reflux:  None.

Other Findings: No evidence of superficial thrombophlebitis or
abnormal fluid collection.
IMPRESSION: No evidence of right lower extremity deep venous thrombosis.

## 2022-10-12 IMAGING — DX DG FOOT COMPLETE 3+V*R*
3 series · 3 of 3 positions shown · non-contrast
Comparison: None.

CLINICAL DATA: Pain and edema.

EXAM:
RIGHT FOOT COMPLETE - 3+ VIEW

[foot ap]
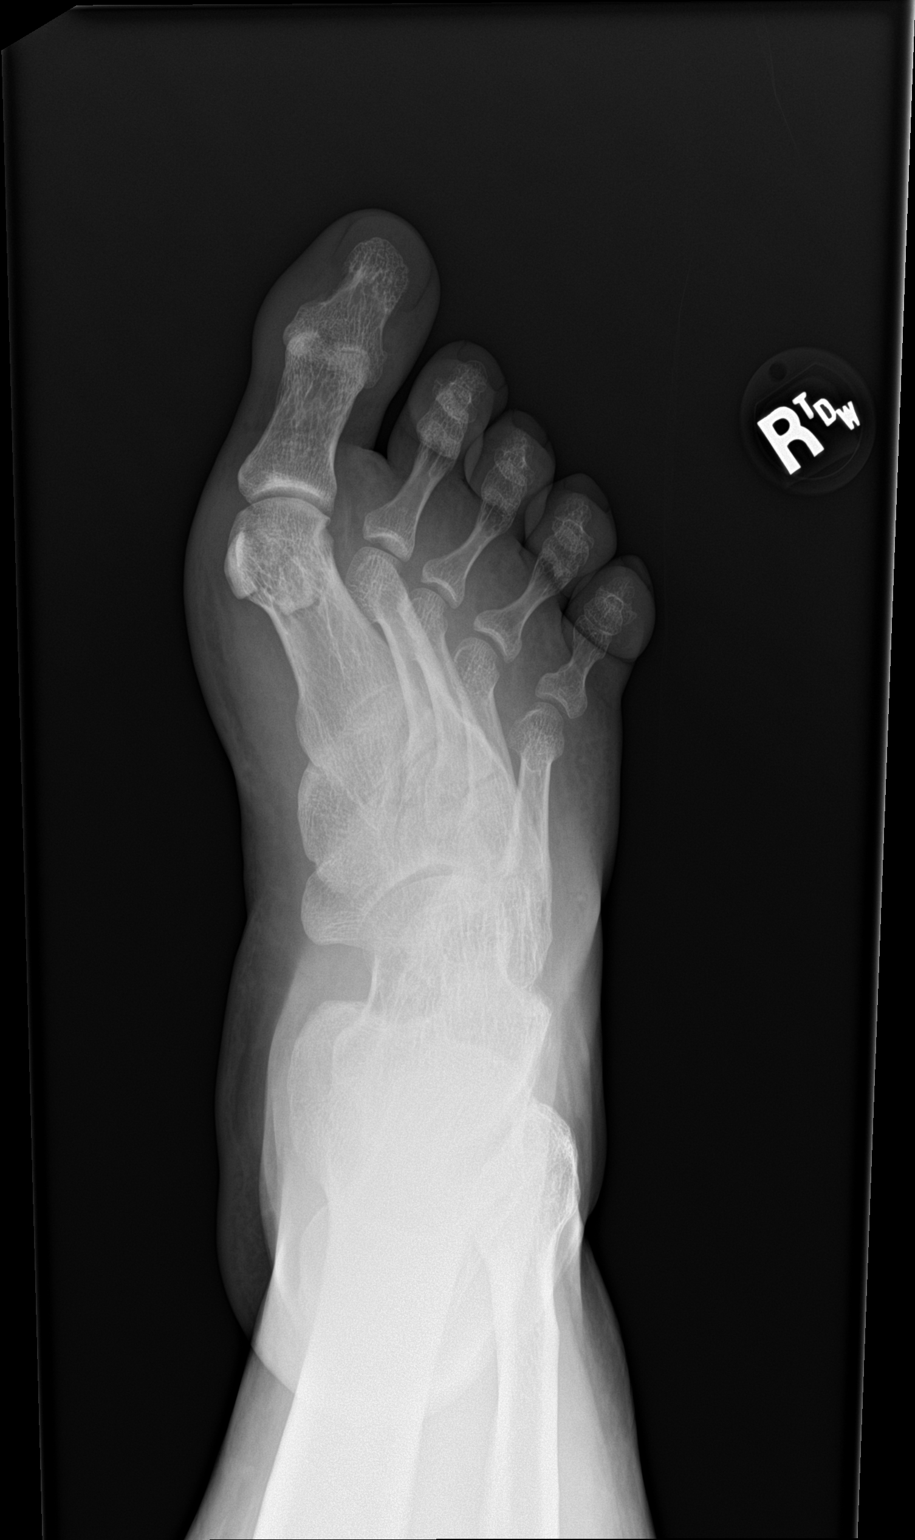

[foot obl]
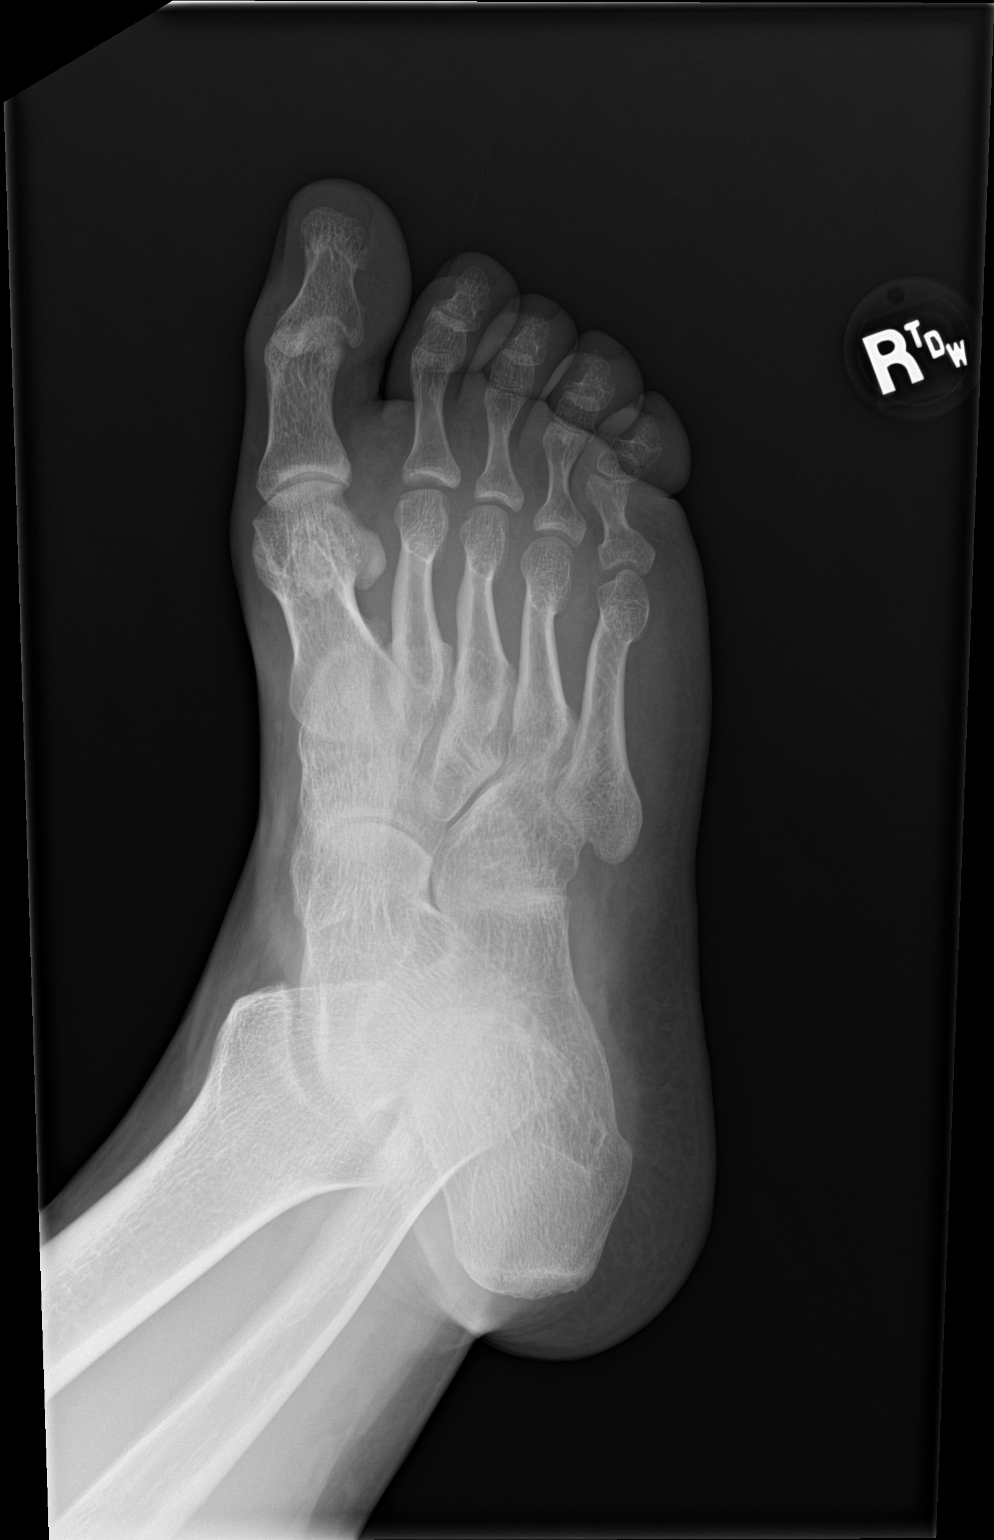

[foot lat]
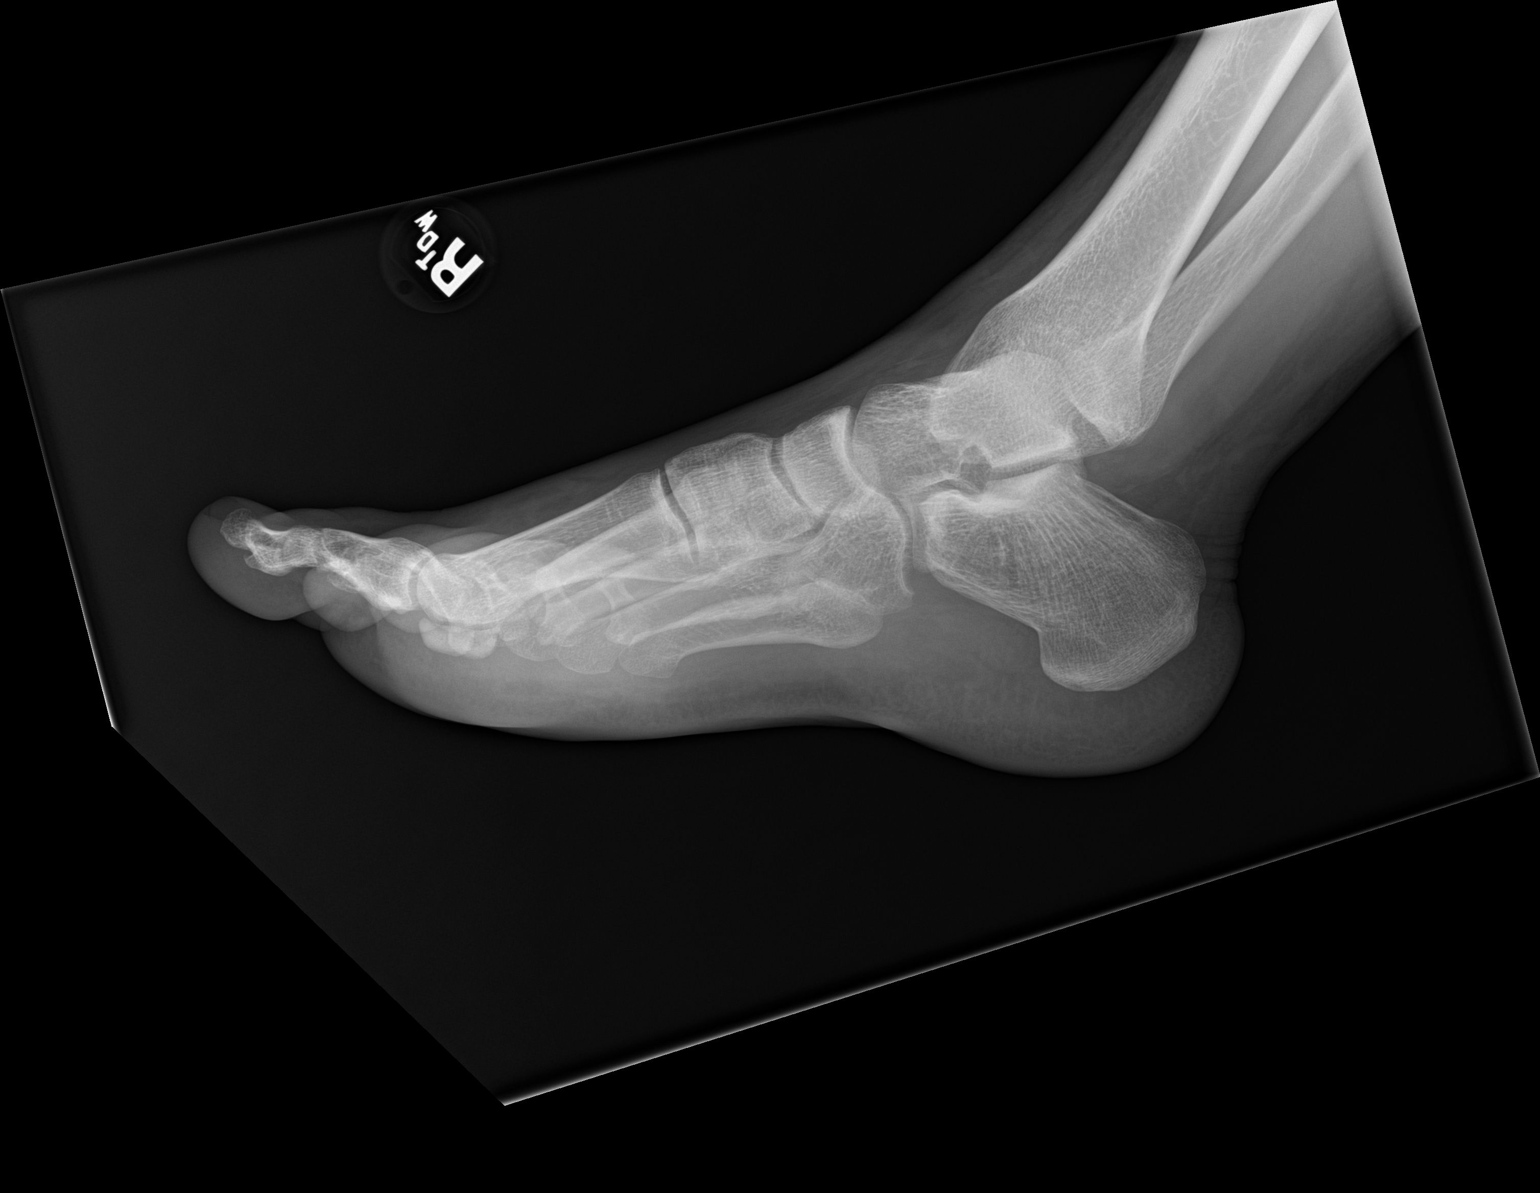

[3 of 3 positions shown; findings below may reference images not displayed]

FINDINGS: There is no evidence of fracture or dislocation. There is no
evidence of arthropathy or other focal bone abnormality. Soft
tissues are unremarkable.
IMPRESSION: Negative.
